# Patient Record
Sex: Male | Born: 1995 | Race: Black or African American | Hispanic: No | Marital: Single | Smoking: Current every day smoker
Health system: Southern US, Community
[De-identification: ages and names within clinical notes are randomized; demographics above are authoritative.]

---
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
## 2019-08-11 ENCOUNTER — Encounter (HOSPITAL_COMMUNITY)
Admission: EM | Disposition: A | Discharge status: Home / Self Care | Payer: Self-pay | Source: Home / Self Care | Attending: Emergency Medicine

## 2019-08-11 ENCOUNTER — Encounter (HOSPITAL_COMMUNITY): Payer: Self-pay | Admitting: Emergency Medicine

## 2019-08-11 ENCOUNTER — Other Ambulatory Visit: Payer: Self-pay | Admitting: Plastic Surgery

## 2019-08-11 ENCOUNTER — Emergency Department (HOSPITAL_COMMUNITY): Payer: Self-pay | Admitting: Certified Registered Nurse Anesthetist

## 2019-08-11 ENCOUNTER — Other Ambulatory Visit: Payer: Self-pay

## 2019-08-11 ENCOUNTER — Emergency Department (HOSPITAL_COMMUNITY): Payer: Self-pay

## 2019-08-11 ENCOUNTER — Ambulatory Visit (HOSPITAL_COMMUNITY)
Admission: EM | Admit: 2019-08-11 | Discharge: 2019-08-11 | Disposition: A | Discharge status: Home / Self Care | Payer: Self-pay | Attending: Plastic Surgery | Admitting: Plastic Surgery

## 2019-08-11 DIAGNOSIS — S02652B Fracture of angle of left mandible, initial encounter for open fracture: Secondary | ICD-10-CM

## 2019-08-11 DIAGNOSIS — Y9389 Activity, other specified: Secondary | ICD-10-CM | POA: Insufficient documentation

## 2019-08-11 DIAGNOSIS — S02652A Fracture of angle of left mandible, initial encounter for closed fracture: Secondary | ICD-10-CM | POA: Insufficient documentation

## 2019-08-11 DIAGNOSIS — F17210 Nicotine dependence, cigarettes, uncomplicated: Secondary | ICD-10-CM | POA: Insufficient documentation

## 2019-08-11 DIAGNOSIS — Z20822 Contact with and (suspected) exposure to covid-19: Secondary | ICD-10-CM | POA: Insufficient documentation

## 2019-08-11 DIAGNOSIS — S0003XA Contusion of scalp, initial encounter: Secondary | ICD-10-CM | POA: Insufficient documentation

## 2019-08-11 HISTORY — PX: CLOSED REDUCTION MANDIBLE WITH MANDIBULOMA: SHX5313

## 2019-08-11 LAB — CBC WITH DIFFERENTIAL/PLATELET
Abs Immature Granulocytes: 0.02 10*3/uL (ref 0.00–0.07)
Basophils Absolute: 0 10*3/uL (ref 0.0–0.1)
Basophils Relative: 1 %
Eosinophils Absolute: 0.2 10*3/uL (ref 0.0–0.5)
Eosinophils Relative: 2 %
HCT: 47.2 % (ref 39.0–52.0)
Hemoglobin: 15.6 g/dL (ref 13.0–17.0)
Immature Granulocytes: 0 %
Lymphocytes Relative: 35 %
Lymphs Abs: 2.2 10*3/uL (ref 0.7–4.0)
MCH: 28.9 pg (ref 26.0–34.0)
MCHC: 33.1 g/dL (ref 30.0–36.0)
MCV: 87.6 fL (ref 80.0–100.0)
Monocytes Absolute: 0.4 10*3/uL (ref 0.1–1.0)
Monocytes Relative: 7 %
Neutro Abs: 3.4 10*3/uL (ref 1.7–7.7)
Neutrophils Relative %: 55 %
Platelets: 374 10*3/uL (ref 150–400)
RBC: 5.39 MIL/uL (ref 4.22–5.81)
RDW: 13.2 % (ref 11.5–15.5)
WBC: 6.2 10*3/uL (ref 4.0–10.5)
nRBC: 0 % (ref 0.0–0.2)

## 2019-08-11 LAB — BASIC METABOLIC PANEL
Anion gap: 13 (ref 5–15)
BUN: 10 mg/dL (ref 6–20)
CO2: 26 mmol/L (ref 22–32)
Calcium: 9.8 mg/dL (ref 8.9–10.3)
Chloride: 102 mmol/L (ref 98–111)
Creatinine, Ser: 1.02 mg/dL (ref 0.61–1.24)
GFR calc Af Amer: >60 mL/min (ref >60)
GFR calc non Af Amer: >60 mL/min (ref >60)
Glucose, Bld: 98 mg/dL (ref 70–99)
Potassium: 3.5 mmol/L (ref 3.5–5.1)
Sodium: 141 mmol/L (ref 135–145)

## 2019-08-11 LAB — SARS CORONAVIRUS 2 BY RT PCR (HOSPITAL ORDER, PERFORMED IN ~~LOC~~ HOSPITAL LAB): SARS Coronavirus 2: NEGATIVE

## 2019-08-11 SURGERY — CLOSED REDUCTION, MANDIBLE, WITH ARCH BAR APPLICATION AND INTERMAXILLARY FIXATION
Anesthesia: General | Site: Mouth

## 2019-08-11 MED ORDER — OXYCODONE HCL 5 MG PO TABS: 5.0000 mg | ORAL_TABLET | ORAL | 0 refills | Status: AC | PRN

## 2019-08-11 MED ORDER — BACITRACIN ZINC 500 UNIT/GM EX OINT
TOPICAL_OINTMENT | CUTANEOUS | Status: DC | PRN
  Administered 2019-08-11: 1 via TOPICAL

## 2019-08-11 MED ORDER — PROPOFOL 10 MG/ML IV BOLUS
INTRAVENOUS | Status: DC | PRN
  Administered 2019-08-11: 100 mg via INTRAVENOUS
  Administered 2019-08-11 (×2): 30 mg via INTRAVENOUS
  Administered 2019-08-11: 200 mg via INTRAVENOUS

## 2019-08-11 MED ORDER — FENTANYL CITRATE (PF) 100 MCG/2ML IJ SOLN
INTRAMUSCULAR | Status: DC | PRN
  Administered 2019-08-11: 50 ug via INTRAVENOUS
  Administered 2019-08-11: 100 ug via INTRAVENOUS
  Administered 2019-08-11: 50 ug via INTRAVENOUS

## 2019-08-11 MED ORDER — AMISULPRIDE (ANTIEMETIC) 5 MG/2ML IV SOLN: 10.0000 mg | Freq: Once | INTRAVENOUS | Status: DC | PRN

## 2019-08-11 MED ORDER — TRANEXAMIC ACID FOR EPISTAXIS
500.0000 mg | Freq: Once | TOPICAL | Status: AC
  Administered 2019-08-11: 500 mg via TOPICAL

## 2019-08-11 MED ORDER — MIDAZOLAM HCL 5 MG/5ML IJ SOLN
INTRAMUSCULAR | Status: DC | PRN
  Administered 2019-08-11: 2 mg via INTRAVENOUS

## 2019-08-11 MED ORDER — CEFAZOLIN SODIUM-DEXTROSE 1-4 GM/50ML-% IV SOLN
1.0000 g | Freq: Once | INTRAVENOUS | Status: AC
  Administered 2019-08-11: 1 g via INTRAVENOUS
  Filled 2019-08-11 (×2): qty 50

## 2019-08-11 MED ORDER — FENTANYL CITRATE (PF) 250 MCG/5ML IJ SOLN
INTRAMUSCULAR | Status: AC
  Filled 2019-08-11: qty 5

## 2019-08-11 MED ORDER — DEXAMETHASONE SODIUM PHOSPHATE 10 MG/ML IJ SOLN
INTRAMUSCULAR | Status: DC | PRN
  Administered 2019-08-11: 10 mg via INTRAVENOUS

## 2019-08-11 MED ORDER — OXYCODONE HCL 5 MG/5ML PO SOLN: 5.0000 mg | Freq: Once | ORAL | Status: DC | PRN

## 2019-08-11 MED ORDER — MORPHINE SULFATE (PF) 4 MG/ML IV SOLN
4.0000 mg | Freq: Once | INTRAVENOUS | Status: AC
  Administered 2019-08-11: 4 mg via INTRAVENOUS
  Filled 2019-08-11: qty 1

## 2019-08-11 MED ORDER — MIDAZOLAM HCL 2 MG/2ML IJ SOLN
INTRAMUSCULAR | Status: AC
  Filled 2019-08-11: qty 2

## 2019-08-11 MED ORDER — FENTANYL CITRATE (PF) 100 MCG/2ML IJ SOLN
25.0000 ug | INTRAMUSCULAR | Status: DC | PRN
  Administered 2019-08-11: 50 ug via INTRAVENOUS

## 2019-08-11 MED ORDER — OXYMETAZOLINE HCL 0.05 % NA SOLN
NASAL | Status: DC | PRN
Start: 2019-08-11 — End: 2019-08-11
  Administered 2019-08-11: 1 via NASAL
  Administered 2019-08-11: 2 via NASAL

## 2019-08-11 MED ORDER — LACTATED RINGERS IV SOLN: INTRAVENOUS | Status: DC

## 2019-08-11 MED ORDER — PROMETHAZINE HCL 25 MG/ML IJ SOLN: 6.2500 mg | INTRAMUSCULAR | Status: DC | PRN

## 2019-08-11 MED ORDER — HYDROMORPHONE HCL 1 MG/ML IJ SOLN
1.0000 mg | Freq: Once | INTRAMUSCULAR | Status: AC
  Administered 2019-08-11: 1 mg via INTRAVENOUS
  Filled 2019-08-11: qty 1

## 2019-08-11 MED ORDER — PROPOFOL 10 MG/ML IV BOLUS
INTRAVENOUS | Status: AC
  Filled 2019-08-11: qty 40

## 2019-08-11 MED ORDER — OXYCODONE HCL 5 MG PO TABS: 5.0000 mg | ORAL_TABLET | Freq: Once | ORAL | Status: DC | PRN

## 2019-08-11 MED ORDER — LIDOCAINE 2% (20 MG/ML) 5 ML SYRINGE
INTRAMUSCULAR | Status: DC | PRN
  Administered 2019-08-11: 60 mg via INTRAVENOUS

## 2019-08-11 MED ORDER — ROCURONIUM BROMIDE 10 MG/ML (PF) SYRINGE
PREFILLED_SYRINGE | INTRAVENOUS | Status: DC | PRN
  Administered 2019-08-11: 70 mg via INTRAVENOUS

## 2019-08-11 MED ORDER — FENTANYL CITRATE (PF) 100 MCG/2ML IJ SOLN
INTRAMUSCULAR | Status: AC
  Administered 2019-08-11: 50 ug via INTRAVENOUS
  Filled 2019-08-11: qty 2

## 2019-08-11 MED ORDER — LIDOCAINE-EPINEPHRINE 1 %-1:100000 IJ SOLN
INTRAMUSCULAR | Status: AC
  Filled 2019-08-11: qty 1

## 2019-08-11 MED ORDER — CEPHALEXIN 250 MG/5ML PO SUSR: 500.0000 mg | Freq: Four times a day (QID) | ORAL | 0 refills | Status: AC

## 2019-08-11 MED ORDER — CEFAZOLIN SODIUM-DEXTROSE 2-3 GM-%(50ML) IV SOLR
INTRAVENOUS | Status: DC | PRN
  Administered 2019-08-11: 2 g via INTRAVENOUS

## 2019-08-11 MED ORDER — ONDANSETRON HCL 4 MG/2ML IJ SOLN
INTRAMUSCULAR | Status: DC | PRN
  Administered 2019-08-11: 4 mg via INTRAVENOUS

## 2019-08-11 MED ORDER — LIDOCAINE-EPINEPHRINE 1 %-1:100000 IJ SOLN
INTRAMUSCULAR | Status: DC | PRN
  Administered 2019-08-11: 8 mL

## 2019-08-11 MED ORDER — PROPOFOL 500 MG/50ML IV EMUL
INTRAVENOUS | Status: DC | PRN
  Administered 2019-08-11: 30 ug/kg/min via INTRAVENOUS

## 2019-08-11 MED ORDER — CEFAZOLIN SODIUM-DEXTROSE 2-4 GM/100ML-% IV SOLN
INTRAVENOUS | Status: AC
  Filled 2019-08-11: qty 100

## 2019-08-11 MED ORDER — OXYMETAZOLINE HCL 0.05 % NA SOLN
NASAL | Status: AC
  Filled 2019-08-11: qty 30

## 2019-08-11 MED ORDER — SUGAMMADEX SODIUM 200 MG/2ML IV SOLN
INTRAVENOUS | Status: DC | PRN
  Administered 2019-08-11: 160 mg via INTRAVENOUS

## 2019-08-11 MED ORDER — BACITRACIN ZINC 500 UNIT/GM EX OINT
TOPICAL_OINTMENT | CUTANEOUS | Status: AC
  Filled 2019-08-11: qty 28.35

## 2019-08-11 SURGICAL SUPPLY — 34 items
BLADE SURG 15 STRL LF DISP TIS (BLADE) ×1 IMPLANT
BLADE SURG 15 STRL SS (BLADE) ×3
CANISTER SUCT 3000ML PPV (MISCELLANEOUS) ×3 IMPLANT
COVER SURGICAL LIGHT HANDLE (MISCELLANEOUS) ×3 IMPLANT
CUTR UPPERFC/MIDFACE PLT (CUTTER) ×3
CUTTER UPPERFC/MIDFACE PLT (CUTTER) IMPLANT
DECANTER SPIKE VIAL GLASS SM (MISCELLANEOUS) ×3 IMPLANT
ELECT COATED BLADE 2.86 ST (ELECTRODE) ×3 IMPLANT
ELECT REM PT RETURN 9FT ADLT (ELECTROSURGICAL) ×3
ELECTRODE REM PT RTRN 9FT ADLT (ELECTROSURGICAL) ×1 IMPLANT
GAUZE SPONGE 4X4 16PLY XRAY LF (GAUZE/BANDAGES/DRESSINGS) ×2 IMPLANT
GLOVE BIO SURGEON STRL SZ 6 (GLOVE) ×9 IMPLANT
GOWN STRL REUS W/ TWL LRG LVL3 (GOWN DISPOSABLE) ×2 IMPLANT
GOWN STRL REUS W/TWL LRG LVL3 (GOWN DISPOSABLE) ×9
KIT BASIN OR (CUSTOM PROCEDURE TRAY) ×3 IMPLANT
KIT TURNOVER KIT B (KITS) ×3 IMPLANT
NDL PRECISIONGLIDE 27X1.5 (NEEDLE) ×1 IMPLANT
NEEDLE PRECISIONGLIDE 27X1.5 (NEEDLE) ×6 IMPLANT
NS IRRIG 1000ML POUR BTL (IV SOLUTION) ×3 IMPLANT
PAD ARMBOARD 7.5X6 YLW CONV (MISCELLANEOUS) ×6 IMPLANT
PENCIL BUTTON HOLSTER BLD 10FT (ELECTRODE) ×3 IMPLANT
PLATE HYBRID GOLD MMF (Plate) ×6 IMPLANT
PLATE HYBRID MMF GOLD (Plate) IMPLANT
SCISSORS WIRE ANG 4 3/4 DISP (INSTRUMENTS) ×3 IMPLANT
SCREW LOCK SELFDRIL 2.0X8M MMF (Screw) ×8 IMPLANT
SCREW LOCKING SELF DRILL 2.0X6 (Screw) ×8 IMPLANT
SUT BONE WAX W31G (SUTURE) ×2 IMPLANT
SUT CHROMIC 4 0 P 3 18 (SUTURE) ×2 IMPLANT
SUT STEEL 4 (SUTURE) ×2 IMPLANT
SYR CONTROL 10ML LL (SYRINGE) ×2 IMPLANT
TOWEL GREEN STERILE FF (TOWEL DISPOSABLE) ×3 IMPLANT
TRAY ENT MC OR (CUSTOM PROCEDURE TRAY) ×3 IMPLANT
TUBING BULK SUCTION (MISCELLANEOUS) ×2 IMPLANT
WATER STERILE IRR 1000ML POUR (IV SOLUTION) ×3 IMPLANT

## 2019-08-11 NOTE — Anesthesia Procedure Notes (Signed)
Procedure Name: Intubation Date/Time: 08/11/2019 1:28 PM Performed by: Elliot Dally, CRNA Pre-anesthesia Checklist: Patient identified, Emergency Drugs available, Suction available and Patient being monitored Patient Re-evaluated:Patient Re-evaluated prior to induction Oxygen Delivery Method: Circle System Utilized Preoxygenation: Pre-oxygenation with 100% oxygen Induction Type: IV induction Ventilation: Mask ventilation without difficulty and Nasal airway inserted- appropriate to patient size Laryngoscope Size: Hyacinth Meeker and 3 Grade View: Grade I Tube type: Oral Nasal Tubes: Nasal prep performed, Nasal Rae and Left Tube size: 7.0 mm Number of attempts: 1 Airway Equipment and Method: Stylet and Oral airway Placement Confirmation: ETT inserted through vocal cords under direct vision,  positive ETCO2 and breath sounds checked- equal and bilateral Tube secured with: Tape Dental Injury: Teeth and Oropharynx as per pre-operative assessment

## 2019-08-11 NOTE — Discharge Instructions (Signed)
Jaw Fracture Eating Plan A break (fracture) of the jaw bone often needs surgery for treatment. After surgery, you will need to eat foods that can be blended so that they can be sipped from a straw or given through a syringe. Work with a diet and nutrition specialist (dietitian) to create an eating plan that helps you get the nutrients you need in order to heal and stay healthy. What are tips for following this plan? General guidelines  All foods in this plan must be blended. Avoid nuts, seeds, skins, peels, bones, or any foods that cannot be blended to the right consistency.  Ask your health care provider about taking a liquid multivitamin to make sure that you get all the vitamins and minerals you need. Cooking   Before blending, remove any skins, seeds, or peels from food.  Cook meats and vegetables until tender.  Cut foods into small pieces and mix with a small amount of liquid in a food processor or blender. Continue to add liquid until the food becomes thin enough to sip through a straw.  Add liquids such as juice, milk, cream, broth, gravy, or vegetable juice to help add flavor to foods.  Heat foods after they have been blended, not before. This reduces the amount of foam created from blending.  If you need to increase calories in food: ? Add protein powder or powdered milk to foods. ? Cook with fats, such as margarine (without trans fat), sour cream, cream cheese, cream, or nut butters. ? Prepare foods with sweeteners, such as honey, ice cream, blackstrap molasses, or sugar. Meal planning  Eat at least three meals and three snacks daily. It is important to make sure that you get enough calories and protein to prevent weight loss and help your body heal, especially after surgery.  Eat a variety of foods from each food group every day, including fruits and vegetables, protein, whole grains, dairy, and healthy fats.  If your teeth and mouth are sensitive to extreme temperatures,  heat or cool your foods to lukewarm temperatures. What foods are recommended? The items listed may not be a complete list. Talk with your dietitian about what dietary choices are best for you. Grains Hot cereals, such as oatmeal, grits, ground wheat cereals, and polenta. Rice and pasta. Couscous. Vegetables All cooked or canned vegetables, without seeds and skins. Vegetable juices. Cooked potatoes, without skins. Fruits Any cooked or canned fruits, without seeds and skins. Fresh, peeled soft fruits, such as bananas and peaches, that can be blended until smooth. All fruit juices, without seeds and skins. Meat and other protein foods Soft-boiled eggs, scrambled eggs, powdered eggs, pasteurized egg mixtures, and custard. Ground meats, such as hamburger, turkey, sausage, and meatloaf. Tender, well-cooked meat, poultry, and fish, prepared without bones or skin. Soft soy foods, such as tofu. Smooth nut butters. Liquid egg substitutes. Dairy Milk. Cheese. Yogurt. Cottage cheese. Pudding. Beverages Coffee (regular or decaffeinated), tea, and mineral water. Liquid supplements that have protein and calories. Fats and oils Any oils. Melted margarine or butter. Ghee. Sour cream. Cream cheese. Avocado. Seasoning and other foods All seasonings and condiments that blend well. Ground spices. Finely ground seeds and nuts. Mustard or any smooth condiment. Summary  Foods in this plan need to be prepared so that they can be sipped from a straw or given through a syringe. Try to have at least three meals and three snacks daily.  Avoid nuts, seeds, skins, peels, bones, or any foods that cannot be blended to   the right consistency. Make sure you eat a variety of foods from each food group every day.  Include a liquid multivitamin in your plan as told by your health care provider or dietitian. This information is not intended to replace advice given to you by your health care provider. Make sure you discuss any  questions you have with your health care provider. Document Revised: 05/11/2018 Document Reviewed: 04/26/2016 Elsevier Patient Education  2020 [employer]  

## 2019-08-11 NOTE — Anesthesia Preprocedure Evaluation (Addendum)
Anesthesia Evaluation  Patient identified by MRN, date of birth, ID band Patient awake    Reviewed: Allergy & Precautions, NPO status , Patient's Chart, lab work & pertinent test results  History of Anesthesia Complications (+) PONV and history of anesthetic complications  Airway Mallampati: IV  TM Distance: >3 FB Neck ROM: Limited  Mouth opening: Limited Mouth Opening Comment:  Limited mouth opening and neck extension due to jaw pain, confident this will improve following induction Dental  (+) Missing, Dental Advisory Given   Pulmonary Current Smoker and Patient abstained from smoking.,    Pulmonary exam normal        Cardiovascular negative cardio ROS Normal cardiovascular exam     Neuro/Psych negative neurological ROS  negative psych ROS   GI/Hepatic negative GI ROS, Neg liver ROS,   Endo/Other  negative endocrine ROS  Renal/GU negative Renal ROS     Musculoskeletal negative musculoskeletal ROS (+)   Abdominal   Peds  Hematology negative hematology ROS (+)   Anesthesia Other Findings Covid test negative   Reproductive/Obstetrics                           Anesthesia Physical Anesthesia Plan  ASA: II  Anesthesia Plan: General   Post-op Pain Management:    Induction: Intravenous  PONV Risk Score and Plan: 2 and Treatment may vary due to age or medical condition, Ondansetron, Dexamethasone, Midazolam and Propofol infusion  Airway Management Planned: Nasal ETT  Additional Equipment: None  Intra-op Plan:   Post-operative Plan: Extubation in OR  Informed Consent: I have reviewed the patients History and Physical, chart, labs and discussed the procedure including the risks, benefits and alternatives for the proposed anesthesia with the patient or authorized representative who has indicated his/her understanding and acceptance.     Dental advisory given  Plan Discussed with:  CRNA and Anesthesiologist  Anesthesia Plan Comments:        Anesthesia Quick Evaluation
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
## 2019-08-11 NOTE — Anesthesia Postprocedure Evaluation (Signed)
Anesthesia Post Note  Patient: Marc Huynh  Procedure(s) Performed: INTRAMAXILLARY FIXATION (N/A Mouth)     Patient location during evaluation: PACU Anesthesia Type: General Level of consciousness: awake and alert Pain management: pain level controlled Vital Signs Assessment: post-procedure vital signs reviewed and stable Respiratory status: spontaneous breathing, nonlabored ventilation and respiratory function stable Cardiovascular status: blood pressure returned to baseline and stable Postop Assessment: no apparent nausea or vomiting Anesthetic complications: no   No complications documented.  Last Vitals:  Vitals:   08/11/19 1510 08/11/19 1525  BP: 130/81 138/81  Pulse: 89 91  Resp: 15 15  Temp:  36.8 C  SpO2: 94% 94%    Last Pain:  Vitals:   08/11/19 1510  TempSrc:   PainSc: Asleep                 Beryle Lathe

## 2019-08-11 NOTE — ED Notes (Signed)
PT changed into hospital gown

## 2019-08-11 NOTE — Transfer of Care (Signed)
Immediate Anesthesia Transfer of Care Note  Patient: Marc Huynh  Procedure(s) Performed: INTRAMAXILLARY FIXATION (N/A Mouth)  Patient Location: PACU  Anesthesia Type:General  Level of Consciousness: drowsy  Airway & Oxygen Therapy: Patient Spontanous Breathing and Patient connected to nasal cannula oxygen  Post-op Assessment: Report given to RN and Post -op Vital signs reviewed and stable  Post vital signs: Reviewed and stable  Last Vitals:  Vitals Value Taken Time  BP    Temp    Pulse    Resp    SpO2      Last Pain:  Vitals:   08/11/19 1230  TempSrc:   PainSc: 3          Complications: No complications documented.

## 2019-08-12 ENCOUNTER — Encounter (HOSPITAL_COMMUNITY): Payer: Self-pay | Admitting: Plastic Surgery
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
